# Patient Record
Sex: Male | Born: 1995 | Hispanic: Yes | Marital: Single | Smoking: Never smoker
Health system: Southern US, Community
[De-identification: ages and names within clinical notes are randomized; demographics above are authoritative.]

---
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
## 2019-02-17 ENCOUNTER — Emergency Department
Admission: EM | Admit: 2019-02-17 | Discharge: 2019-02-17 | Disposition: A | Discharge status: Home / Self Care | Payer: HRSA Program | Attending: Emergency Medicine | Admitting: Emergency Medicine

## 2019-02-17 ENCOUNTER — Encounter: Payer: Self-pay | Admitting: Emergency Medicine

## 2019-02-17 ENCOUNTER — Other Ambulatory Visit: Payer: Self-pay

## 2019-02-17 DIAGNOSIS — R1110 Vomiting, unspecified: Secondary | ICD-10-CM | POA: Insufficient documentation

## 2019-02-17 DIAGNOSIS — Z20828 Contact with and (suspected) exposure to other viral communicable diseases: Secondary | ICD-10-CM | POA: Diagnosis not present

## 2019-02-17 LAB — SARS CORONAVIRUS 2 BY RT PCR (HOSPITAL ORDER, PERFORMED IN ~~LOC~~ HOSPITAL LAB): SARS Coronavirus 2: NEGATIVE

## 2019-02-17 NOTE — ED Triage Notes (Signed)
Pt arrives POV with c/o needing Covid test for work. Per pt, he had one episode of vomiting at work and his boss refused to let him come back to work before test result. Pt is in NAD and has no complaints at this time.

## 2019-02-17 NOTE — ED Provider Notes (Signed)
[facility] Emergency Department Provider Note  ____________________________________________   I have reviewed the triage vital signs and the nursing notes.   HISTORY  Chief Complaint Emesis  History limited by: Not Limited   HPI Tommy Pacheco is a 23 y.o. male who presents to the emergency department today at the request of his employer for covid test after an episode of vomiting today. The patient states he was at work when he started having some abdominal discomfort. He thinks it might have been something he ate. He states at the time of my exam he feels better. Denies any recent illness or fever.    Records reviewed.   History reviewed. No pertinent past medical history.  There are no active problems to display for this patient.   History reviewed. No pertinent surgical history.  Prior to Admission medications   Not on File    Allergies Patient has no known allergies.  No family history on file.  Social History Social History   Tobacco Use  . Smoking status: Never Smoker  . Smokeless tobacco: Never Used  Substance Use Topics  . Alcohol use: No  . Drug use: No    Review of Systems Constitutional: No fever/chills Eyes: No visual changes. ENT: No sore throat. Cardiovascular: Denies chest pain. Respiratory: Denies shortness of breath. Gastrointestinal: No abdominal pain. Positive for emesis. Genitourinary: Negative for dysuria. Musculoskeletal: Negative for back pain. Skin: Negative for rash. Neurological: Negative for headaches, focal weakness or numbness.  ____________________________________________   PHYSICAL EXAM:  VITAL SIGNS: ED Triage Vitals  Enc Vitals Group     BP 02/17/19 0114 138/66     Pulse Rate 02/17/19 0114 64     Resp 02/17/19 0114 18     Temp 02/17/19 0114 98.7 F (37.1 C)     Temp Source 02/17/19 0114 Oral     SpO2 02/17/19 0114 100 %     Weight 02/17/19 0108 210 lb (95.3 kg)     Height 02/17/19 0108 5'  7" (1.702 m)     Head Circumference --      Peak Flow --      Pain Score 02/17/19 0108 0   Constitutional: Alert and oriented.  Eyes: Conjunctivae are normal.  ENT      Head: Normocephalic and atraumatic.      Nose: No congestion/rhinnorhea.      Mouth/Throat: Mucous membranes are moist.      Neck: No stridor. Cardiovascular: Normal rate, regular rhythm.  No murmurs, rubs, or gallops.  Respiratory: Normal respiratory effort without tachypnea nor retractions. Breath sounds are clear and equal bilaterally. No wheezes/rales/rhonchi. Gastrointestinal: Soft and non tender. No rebound. No guarding.  Genitourinary: Deferred Musculoskeletal: Normal range of motion in all extremities.  Neurologic:  Normal speech and language. No gross focal neurologic deficits are appreciated.  Skin:  Skin is warm, dry and intact. No rash noted. Psychiatric: Mood and affect are normal. Speech and behavior are normal. Patient exhibits appropriate insight and judgment.  ____________________________________________    LABS (pertinent positives/negatives)  COVID negative  ____________________________________________   EKG  None  ____________________________________________    RADIOLOGY  None  ____________________________________________   PROCEDURES  Procedures  ____________________________________________   INITIAL IMPRESSION / ASSESSMENT AND PLAN / ED COURSE  Pertinent labs & imaging results that were available during my care of the patient were reviewed by me and considered in my medical decision making (see chart for details).   Patient presented to the emergency department today as advised  by employer for concern for COVID after an episode of emesis. Patient feels better at the time of my exam. COVID test was negative.   ____________________________________________   FINAL CLINICAL IMPRESSION(S) / ED DIAGNOSES  Final diagnoses:  Vomiting, intractability of vomiting not  specified, presence of nausea not specified, unspecified vomiting type     Note: This dictation was prepared with Dragon dictation. Any transcriptional errors that result from this process are unintentional     Phineas SemenGoodman, Maleah Rabago, MD 02/17/19 0300

## 2019-02-17 NOTE — Discharge Instructions (Signed)
Please seek medical attention for any high fevers, chest pain, shortness of breath, change in behavior, persistent vomiting, bloody stool or any other new or concerning symptoms.  

## 2019-03-20 ENCOUNTER — Other Ambulatory Visit: Payer: Self-pay

## 2019-03-20 DIAGNOSIS — Z20822 Contact with and (suspected) exposure to covid-19: Secondary | ICD-10-CM

## 2019-03-21 LAB — NOVEL CORONAVIRUS, NAA: SARS-CoV-2, NAA: NOT DETECTED

## 2019-04-04 ENCOUNTER — Other Ambulatory Visit: Payer: Self-pay

## 2019-04-04 ENCOUNTER — Emergency Department
Admission: EM | Admit: 2019-04-04 | Discharge: 2019-04-04 | Disposition: A | Discharge status: Home / Self Care | Payer: Self-pay | Attending: Emergency Medicine | Admitting: Emergency Medicine

## 2019-04-04 ENCOUNTER — Emergency Department: Payer: Worker's Compensation

## 2019-04-04 DIAGNOSIS — Y9389 Activity, other specified: Secondary | ICD-10-CM | POA: Diagnosis not present

## 2019-04-04 DIAGNOSIS — W010XXA Fall on same level from slipping, tripping and stumbling without subsequent striking against object, initial encounter: Secondary | ICD-10-CM | POA: Diagnosis not present

## 2019-04-04 DIAGNOSIS — M545 Low back pain, unspecified: Secondary | ICD-10-CM

## 2019-04-04 DIAGNOSIS — Y990 Civilian activity done for income or pay: Secondary | ICD-10-CM | POA: Diagnosis not present

## 2019-04-04 DIAGNOSIS — Y9289 Other specified places as the place of occurrence of the external cause: Secondary | ICD-10-CM | POA: Insufficient documentation

## 2019-04-04 MED ORDER — HYDROCODONE-ACETAMINOPHEN 5-325 MG PO TABS
2.0000 | ORAL_TABLET | Freq: Once | ORAL | Status: AC
  Administered 2019-04-04: 2 via ORAL
  Filled 2019-04-04: qty 2

## 2019-04-04 MED ORDER — TRAMADOL HCL 50 MG PO TABS: 50.0000 mg | ORAL_TABLET | Freq: Four times a day (QID) | ORAL | 0 refills | Status: DC | PRN

## 2019-04-04 NOTE — ED Provider Notes (Signed)
[facility] Emergency Department Provider Note  Time seen: 4:56 AM  I have reviewed the triage vital signs and the nursing notes.   HISTORY  Chief Complaint Back Pain   HPI Tommy Pacheco is a 23 y.o. male with no past medical history presents to the emergency department for back pain after a fall.  According to the patient he fell while at work today landing on his buttocks.  Patient is complaining of lower back pain.  Denies any other injuries.  Denies hitting his head.  Denies LOC.  Denies any recent fever cough or shortness of breath.   History reviewed. No pertinent past medical history.  There are no active problems to display for this patient.   History reviewed. No pertinent surgical history.  Prior to Admission medications   Not on File    No Known Allergies  No family history on file.  Social History Social History   Tobacco Use  . Smoking status: Never Smoker  . Smokeless tobacco: Never Used  Substance Use Topics  . Alcohol use: No  . Drug use: No    Review of Systems Constitutional: Negative for fever. Cardiovascular: Negative for chest pain. Respiratory: Negative for shortness of breath. Gastrointestinal: Negative for abdominal pain Musculoskeletal: Moderate low back pain. Skin: Negative for skin complaints  Neurological: Negative for headache All other ROS negative  ____________________________________________   PHYSICAL EXAM:  VITAL SIGNS: ED Triage Vitals  Enc Vitals Group     BP 04/04/19 0440 140/85     Pulse Rate 04/04/19 0440 71     Resp 04/04/19 0440 19     Temp 04/04/19 0440 98.2 F (36.8 C)     Temp Source 04/04/19 0440 Oral     SpO2 04/04/19 0440 94 %     Weight 04/04/19 0438 210 lb (95.3 kg)     Height 04/04/19 0438 5\' 4"  (1.626 m)     Head Circumference --      Peak Flow --      Pain Score 04/04/19 0438 5     Pain Loc --      Pain Edu? --      Excl. in [place]? --     Constitutional: Alert and oriented.  Well appearing and in no distress. Eyes: Normal exam ENT      Head: Normocephalic and atraumatic      Mouth/Throat: Mucous membranes are moist. Cardiovascular: Normal rate, regular rhythm.  Respiratory: Normal respiratory effort without tachypnea nor retractions. Breath sounds are clear Gastrointestinal: Soft and nontender. No distention.   Musculoskeletal: Moderate L-spine tenderness palpation without deformity noted.  Neurologically intact distally. Neurologic:  Normal speech and language. No gross focal neurologic deficits  Skin:  Skin is warm, dry and intact.  Psychiatric: Mood and affect are normal.   ____________________________________________     RADIOLOGY  X-ray negative  ____________________________________________   INITIAL IMPRESSION / ASSESSMENT AND PLAN / ED COURSE  Pertinent labs & imaging results that were available during my care of the patient were reviewed by me and considered in my medical decision making (see chart for details).   Patient presents to the emergency department for low back pain after a fall at work.  We will obtain an x-ray to rule out compression fracture.  We will dose pain medication while awaiting x-ray results.  X-rays are negative.  We will discharge with short course of Ultram and PCP follow-up.  Tommy Pacheco was evaluated in Emergency Department on 04/04/2019 for the symptoms  described in the history of present illness. He was evaluated in the context of the global COVID-19 pandemic, which necessitated consideration that the patient might be at risk for infection with the SARS-CoV-2 virus that causes COVID-19. Institutional protocols and algorithms that pertain to the evaluation of patients at risk for COVID-19 are in a state of rapid change based on information released by regulatory bodies including the CDC and federal and state organizations. These policies and algorithms were followed during the patient's care in the  ED.  ____________________________________________   FINAL CLINICAL IMPRESSION(S) / ED DIAGNOSES  Fall Back pain   Minna Antis, MD 04/04/19 [phone]

## 2019-04-04 NOTE — ED Triage Notes (Signed)
Patient reports falling in whip cream at work. Patient c/o pain post fall.

## 2020-08-14 IMAGING — CR DG LUMBAR SPINE COMPLETE 4+V
5 series · 5 of 5 positions shown · non-contrast
Comparison: None.

CLINICAL DATA: Fall with pain

EXAM:
LUMBAR SPINE - COMPLETE 4+ VIEW

[l-spine ap]
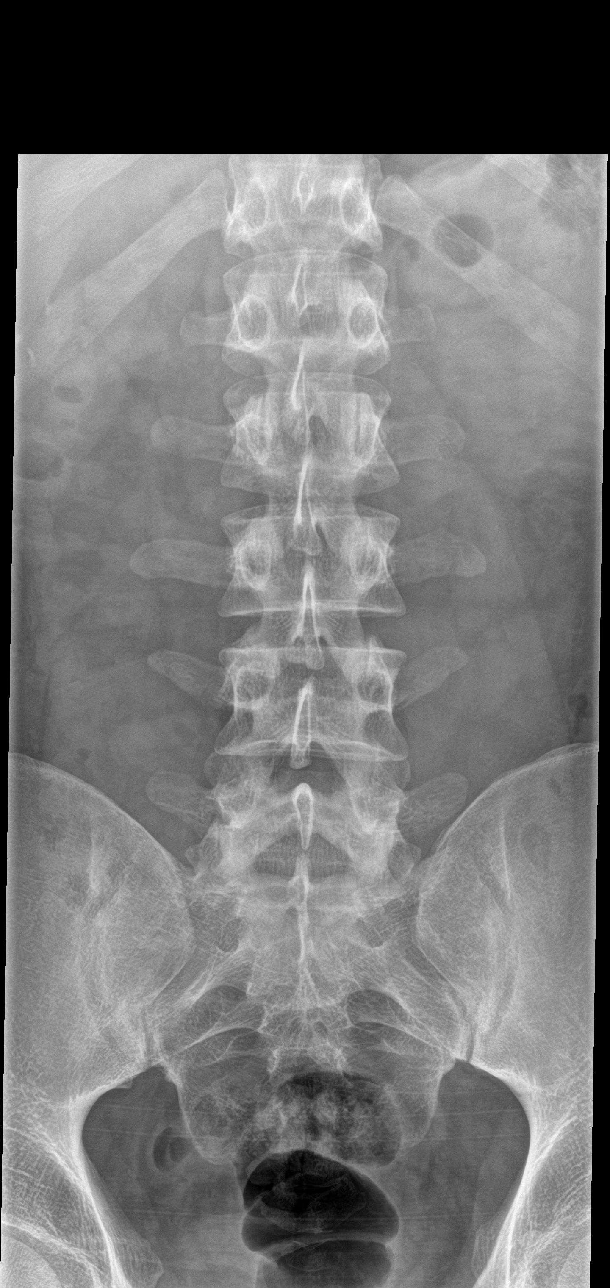

[l-spine obl (1 of 2)]
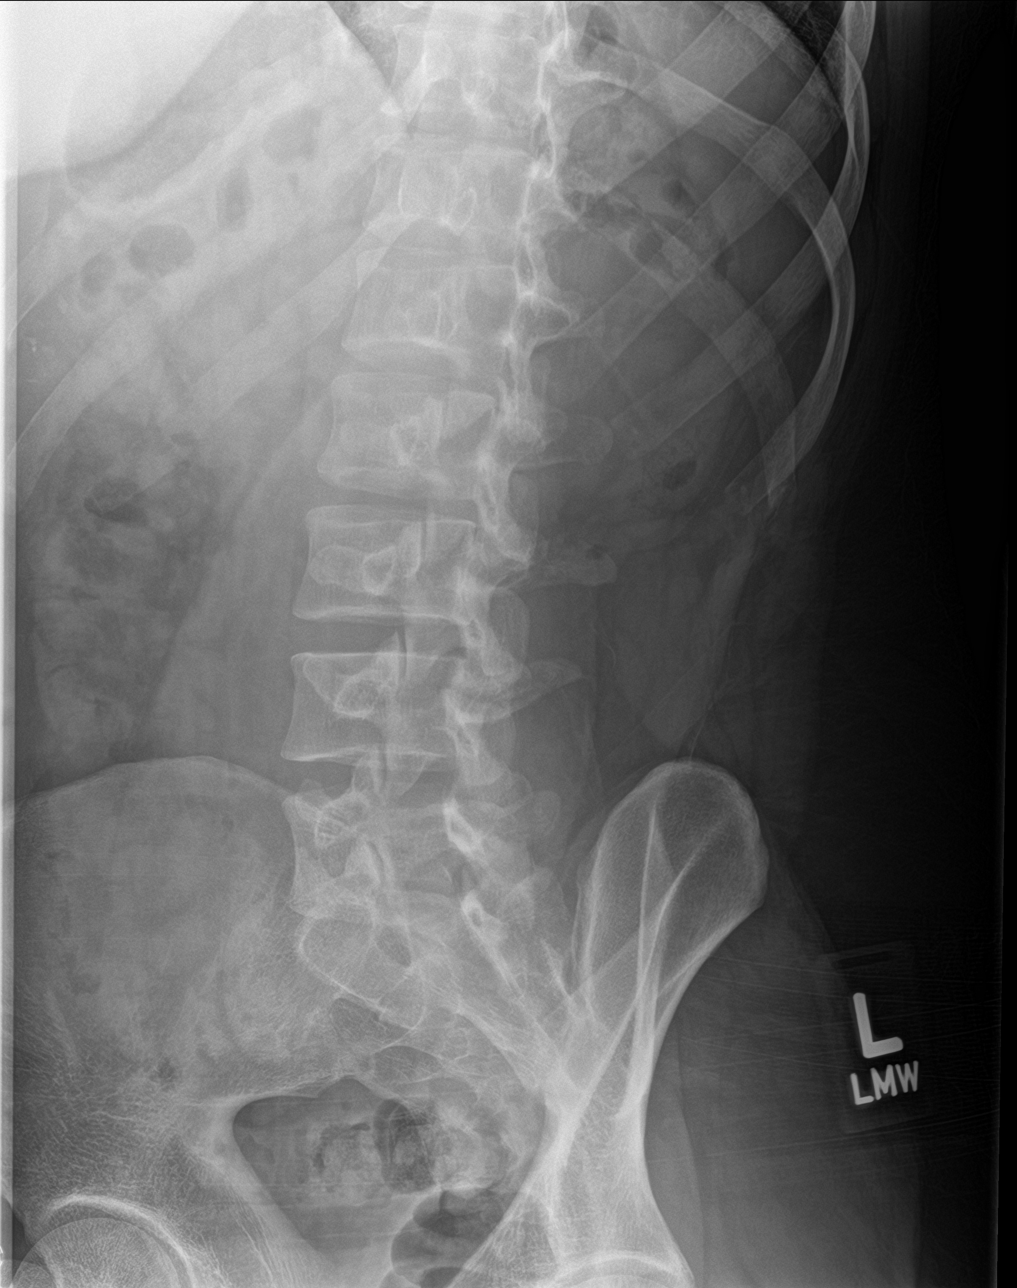

[l-spine obl (2 of 2)]
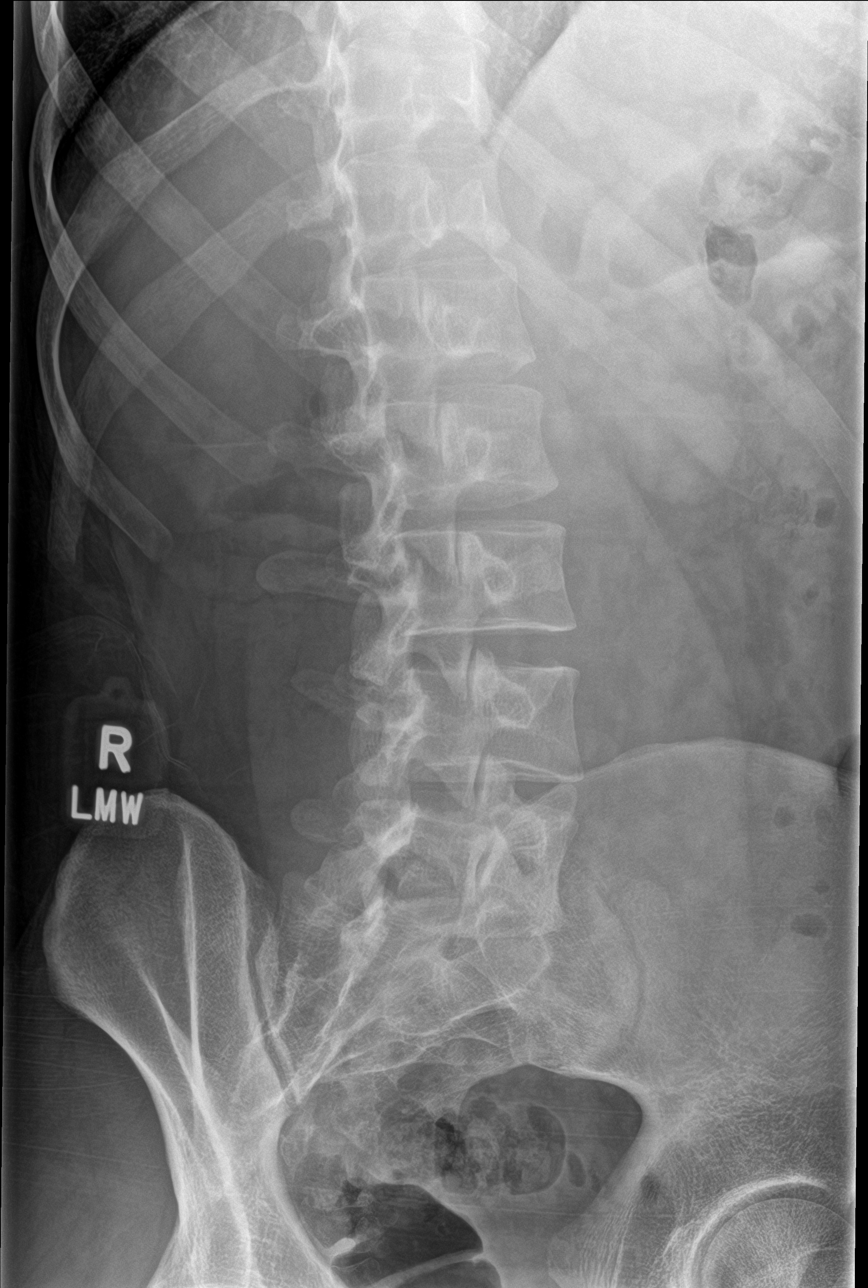

[l-spine lat]
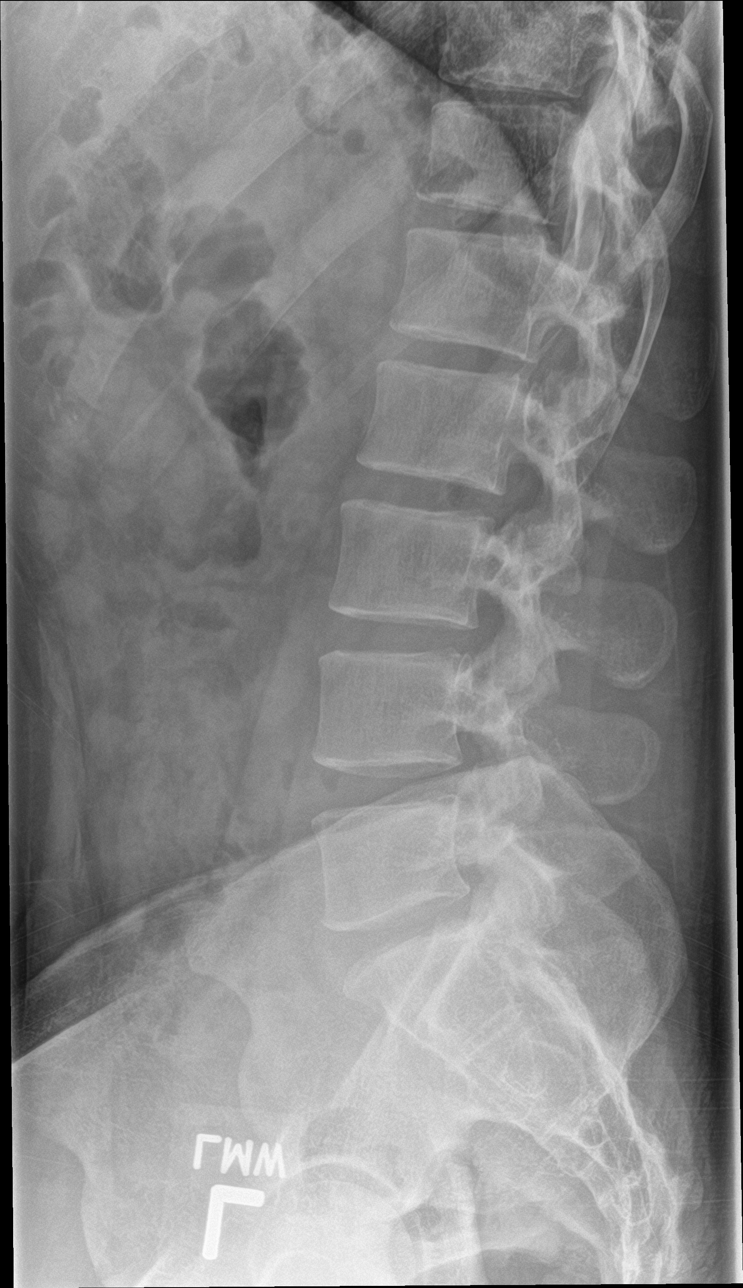

[l-spine spot]
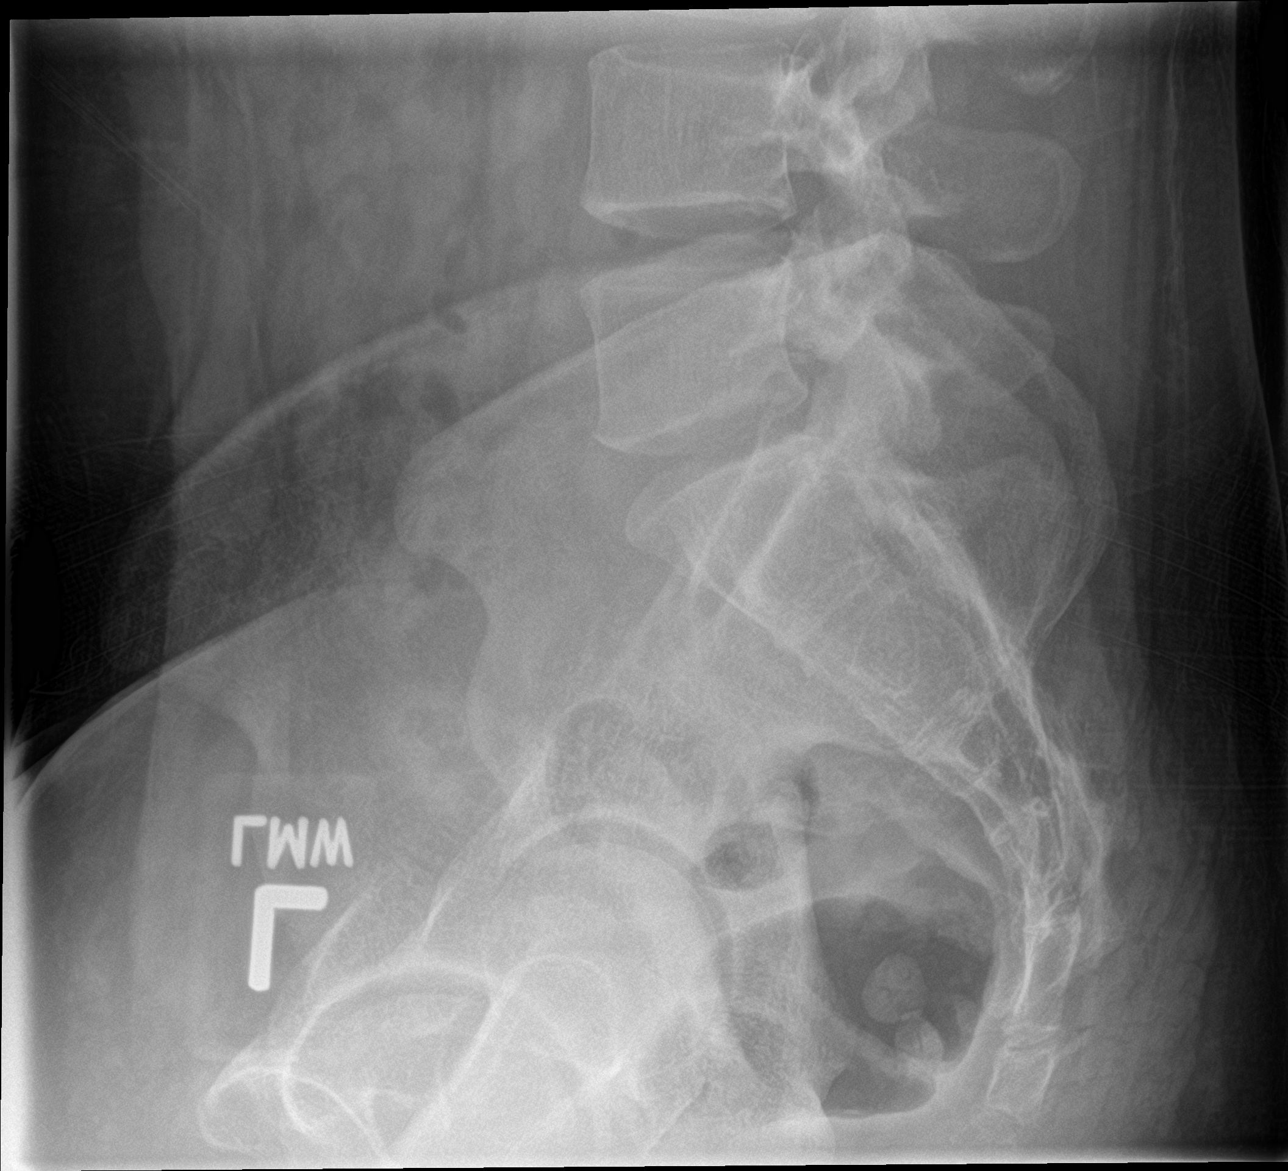

[5 of 5 positions shown; findings below may reference images not displayed]

FINDINGS: There is no evidence of lumbar spine fracture. Alignment is normal.
Intervertebral disc spaces are maintained.
IMPRESSION: Negative.

## 2023-04-10 ENCOUNTER — Other Ambulatory Visit: Payer: Self-pay

## 2023-04-10 ENCOUNTER — Encounter: Payer: Self-pay | Admitting: Emergency Medicine

## 2023-04-10 ENCOUNTER — Emergency Department
Admission: EM | Admit: 2023-04-10 | Discharge: 2023-04-10 | Disposition: A | Discharge status: Home / Self Care | Payer: Self-pay | Attending: Emergency Medicine | Admitting: Emergency Medicine

## 2023-04-10 DIAGNOSIS — K0889 Other specified disorders of teeth and supporting structures: Secondary | ICD-10-CM

## 2023-04-10 DIAGNOSIS — K029 Dental caries, unspecified: Secondary | ICD-10-CM | POA: Insufficient documentation

## 2023-04-10 MED ORDER — CHLORHEXIDINE GLUCONATE 0.12 % MT SOLN: 15.0000 mL | Freq: Two times a day (BID) | OROMUCOSAL | 0 refills | Status: AC

## 2023-04-10 MED ORDER — AMOXICILLIN-POT CLAVULANATE 875-125 MG PO TABS: 1.0000 | ORAL_TABLET | Freq: Two times a day (BID) | ORAL | 0 refills | Status: AC

## 2023-04-10 NOTE — Discharge Instructions (Signed)
Please follow-up with a dentist as soon as possible.  Please take the medications as prescribed.  Please return for any new, worsening, or change in symptoms or other concerns.  It was a pleasure caring for you today.

## 2023-04-10 NOTE — ED Provider Notes (Signed)
[facility] Provider Note    Event Date/Time   First MD Initiated Contact with Patient 04/10/23 [phone]     (approximate)   History   Dental Pain   HPI  Tommy Pacheco is a 27 y.o. male who presents today for evaluation of dental pain.  Patient reports that this has been ongoing for approximately 1 week.  He does not have a dentist.  He has not had any facial or neck swelling or erythema.  No sublingual swelling.  He is still able to eat and drink.  No difficulty swallowing.  No fevers or chills.  There are no problems to display for this patient.         Physical Exam   Triage Vital Signs: ED Triage Vitals  Encounter Vitals Group     BP 04/10/23 0926 (!) 156/96     Systolic BP Percentile --      Diastolic BP Percentile --      Pulse Rate 04/10/23 0926 (!) 110     Resp 04/10/23 0926 18     Temp 04/10/23 0926 98.6 F (37 C)     Temp Source 04/10/23 0926 Oral     SpO2 04/10/23 0926 99 %     Weight 04/10/23 0922 210 lb 1.6 oz (95.3 kg)     Height 04/10/23 0922 5\' 4"  (1.626 m)     Head Circumference --      Peak Flow --      Pain Score 04/10/23 0922 8     Pain Loc --      Pain Education --      Exclude from Growth Chart --     Most recent vital signs: Vitals:   04/10/23 0926 04/10/23 1052  BP: (!) 156/96 134/70  Pulse: (!) 110 94  Resp: 18 18  Temp: 98.6 F (37 C) 98.4 F (36.9 C)  SpO2: 99% 98%    Physical Exam Vitals and nursing note reviewed.  Constitutional:      General: Awake and alert. No acute distress.    Appearance: Normal appearance. The patient is normal weight.  HENT:     Head: Normocephalic and atraumatic.     Mouth: Mucous membranes are moist.  Poor dentition diffusely, dental decay noted to tooth #4 and #29 with tenderness to palpation.  There is no gingival fluctuance.  There is no sublingual swelling.  No facial or neck swelling or erythema.  No trismus Eyes:     General: PERRL. Normal EOMs        Right eye: No  discharge.        Left eye: No discharge.     Conjunctiva/sclera: Conjunctivae normal.  Cardiovascular:     Rate and Rhythm: Normal rate and regular rhythm.     Pulses: Normal pulses.  Pulmonary:     Effort: Pulmonary effort is normal. No respiratory distress.     Breath sounds: Normal breath sounds.  Abdominal:     Abdomen is soft. There is no abdominal tenderness. No rebound or guarding. No distention. Musculoskeletal:        General: No swelling. Normal range of motion.     Cervical back: Normal range of motion and neck supple.  Skin:    General: Skin is warm and dry.     Capillary Refill: Capillary refill takes less than 2 seconds.     Findings: No rash.  Neurological:     Mental Status: The patient is awake and alert.  ED Results / Procedures / Treatments   Labs (all labs ordered are listed, but only abnormal results are displayed) Labs Reviewed - No data to display   EKG     RADIOLOGY     PROCEDURES:  Critical Care performed:   Procedures   MEDICATIONS ORDERED IN ED: Medications - No data to display   IMPRESSION / MDM / ASSESSMENT AND PLAN / ED COURSE  I reviewed the triage vital signs and the nursing notes.   Differential diagnosis includes, but is not limited to, pulpitis, dental caries, dental decay, abscess.  No recent emergency department visits.  Patient is awake and alert, hemodynamically stable and afebrile.Patient has tenderness over 1 of her teeth and poor dentition, I suspect some dental caries vs pulpitits vs decay. No gingival swelling or fluctuance concerning for gingival abscess.  No trismus, nuchal rigidity, neck pain, hot potato voice, uvular deviation or malocclusion to suggest deep space infection. No sublingual swelling concerning for Ludwig's angina.  Patient was started on antibiotics and chlorhexidine mouth rinse.  Patient was treated symptomatically in the emergency department. Discussed care plan, return precautions, and  advised close outpatient follow-up with dentist. Patient agrees with plan of care.   Patient's presentation is most consistent with acute complicated illness / injury requiring diagnostic workup.      FINAL CLINICAL IMPRESSION(S) / ED DIAGNOSES   Final diagnoses:  Pain, dental     Rx / DC Orders   ED Discharge Orders          Ordered    amoxicillin-clavulanate (AUGMENTIN) 875-125 MG tablet  2 times daily        04/10/23 1043    chlorhexidine (PERIDEX) 0.12 % solution  2 times daily        04/10/23 1043             Note:  This document was prepared using Dragon voice recognition software and may include unintentional dictation errors.   Jackelyn Hoehn, PA-C 04/10/23 1401    Sharyn Creamer, MD 04/10/23 1655

## 2023-04-10 NOTE — ED Triage Notes (Signed)
Dental pain to right lower jaw since last Friday, worsening on Sunday

## 2023-04-10 NOTE — ED Notes (Signed)
See triage note  Presents with dental pain  States pain started on Friday  Became worse on worse   denies ant trauma
